# Patient Record
Sex: Male | Born: 1995 | Race: Black or African American | Hispanic: No | Marital: Single | Smoking: Never smoker
Health system: Southern US, Community
[De-identification: ages and names within clinical notes are randomized; demographics above are authoritative.]

## PROBLEM LIST (undated history)

## (undated) DIAGNOSIS — F988 Other specified behavioral and emotional disorders with onset usually occurring in childhood and adolescence: Secondary | ICD-10-CM

---
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
## 2017-09-18 ENCOUNTER — Encounter (HOSPITAL_COMMUNITY): Payer: Self-pay

## 2017-09-18 ENCOUNTER — Emergency Department (HOSPITAL_COMMUNITY)
Admission: EM | Admit: 2017-09-18 | Discharge: 2017-09-18 | Disposition: A | Discharge status: Home / Self Care | Payer: No Typology Code available for payment source | Attending: Emergency Medicine | Admitting: Emergency Medicine

## 2017-09-18 ENCOUNTER — Emergency Department (HOSPITAL_COMMUNITY): Payer: No Typology Code available for payment source

## 2017-09-18 DIAGNOSIS — Y9389 Activity, other specified: Secondary | ICD-10-CM | POA: Diagnosis not present

## 2017-09-18 DIAGNOSIS — M7918 Myalgia, other site: Secondary | ICD-10-CM | POA: Diagnosis not present

## 2017-09-18 DIAGNOSIS — S161XXA Strain of muscle, fascia and tendon at neck level, initial encounter: Secondary | ICD-10-CM | POA: Diagnosis not present

## 2017-09-18 DIAGNOSIS — S199XXA Unspecified injury of neck, initial encounter: Secondary | ICD-10-CM | POA: Diagnosis present

## 2017-09-18 DIAGNOSIS — F909 Attention-deficit hyperactivity disorder, unspecified type: Secondary | ICD-10-CM | POA: Insufficient documentation

## 2017-09-18 DIAGNOSIS — Y929 Unspecified place or not applicable: Secondary | ICD-10-CM | POA: Insufficient documentation

## 2017-09-18 DIAGNOSIS — Y999 Unspecified external cause status: Secondary | ICD-10-CM | POA: Insufficient documentation

## 2017-09-18 HISTORY — DX: Other specified behavioral and emotional disorders with onset usually occurring in childhood and adolescence: F98.8

## 2017-09-18 MED ORDER — NAPROXEN 375 MG PO TABS: 375.0000 mg | ORAL_TABLET | Freq: Two times a day (BID) | ORAL | 0 refills | Status: DC

## 2017-09-18 MED ORDER — CYCLOBENZAPRINE HCL 10 MG PO TABS: 10.0000 mg | ORAL_TABLET | Freq: Two times a day (BID) | ORAL | 0 refills | Status: DC | PRN

## 2017-09-18 MED ORDER — CYCLOBENZAPRINE HCL 10 MG PO TABS
10.0000 mg | ORAL_TABLET | Freq: Once | ORAL | Status: AC
  Administered 2017-09-18: 10 mg via ORAL
  Filled 2017-09-18: qty 1

## 2017-09-18 MED ORDER — IBUPROFEN 400 MG PO TABS
400.0000 mg | ORAL_TABLET | Freq: Once | ORAL | Status: AC
  Administered 2017-09-18: 400 mg via ORAL
  Filled 2017-09-18: qty 1
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
## 2020-03-19 ENCOUNTER — Ambulatory Visit: Payer: Self-pay | Admitting: Family Medicine

## 2023-05-08 ENCOUNTER — Encounter (HOSPITAL_COMMUNITY): Payer: Self-pay

## 2023-05-08 ENCOUNTER — Ambulatory Visit (HOSPITAL_COMMUNITY)
Admission: EM | Admit: 2023-05-08 | Discharge: 2023-05-08 | Disposition: A | Discharge status: Home / Self Care | Payer: Medicaid Other | Attending: Emergency Medicine | Admitting: Emergency Medicine

## 2023-05-08 DIAGNOSIS — L02211 Cutaneous abscess of abdominal wall: Secondary | ICD-10-CM

## 2023-05-08 MED ORDER — CHLORHEXIDINE GLUCONATE 4 % EX SOLN: Freq: Every day | CUTANEOUS | 0 refills | Status: AC | PRN

## 2023-05-08 MED ORDER — DOXYCYCLINE HYCLATE 100 MG PO CAPS: 100.0000 mg | ORAL_CAPSULE | Freq: Two times a day (BID) | ORAL | 0 refills | Status: AC

## 2023-05-08 NOTE — ED Provider Notes (Signed)
[facility]    CSN: [id number] Arrival date & time: 05/08/23  1621      History   Chief Complaint Chief Complaint  Patient presents with   Skin Ulcer    HPI Thomas Richardson is a 27 y.o. male.   Patient presents to clinic with an abscess to his left lower quadrant of his abdominal pain for the past 4 days.  He did notice some purulent drainage while he was taking a hot shower.  Reports the area is painful and hot to touch.  Patient has been taking some leftover amoxicillin without much relief.  Has not had any medication for pain.  No previous history of abscesses.  The history is provided by the patient and medical records.    Past Medical History:  Diagnosis Date   Attention deficit disorder (ADD)     There are no problems to display for this patient.   History reviewed. No pertinent surgical history.     Home Medications    Prior to Admission medications   Medication Sig Start Date End Date Taking? Authorizing Provider  chlorhexidine (HIBICLENS) 4 % external liquid Apply topically daily as needed. 05/08/23  Yes Rinaldo Ratel, [place] N, FNP  doxycycline (VIBRAMYCIN) 100 MG capsule Take 1 capsule (100 mg total) by mouth 2 (two) times daily. 05/08/23  Yes Rinaldo Ratel, [place], FNP  cyclobenzaprine (FLEXERIL) 10 MG tablet Take 1 tablet (10 mg total) by mouth 2 (two) times daily as needed for muscle spasms. 09/18/17   Gwyneth Sprout, MD  naproxen (NAPROSYN) 375 MG tablet Take 1 tablet (375 mg total) by mouth 2 (two) times daily. 09/18/17   Gwyneth Sprout, MD    Family History History reviewed. No pertinent family history.  Social History Social History   Tobacco Use   Smoking status: Never   Smokeless tobacco: Never  Vaping Use   Vaping Use: Unknown  Substance Use Topics   Alcohol use: No   Drug use: No     Allergies   Patient has no known allergies.   Review of Systems Review of Systems  Constitutional:  Negative for fever.      Physical Exam Triage Vital Signs ED Triage Vitals [05/08/23 1635]  Enc Vitals Group     BP 123/74     Pulse Rate (!) 109     Resp 16     Temp 98.7 F (37.1 C)     Temp src      SpO2 98 %     Weight      Height      Head Circumference      Peak Flow      Pain Score 5     Pain Loc      Pain Edu?      Excl. in GC?    No data found.  Updated Vital Signs BP 123/74   Pulse (!) 109   Temp 98.7 F (37.1 C)   Resp 16   SpO2 98%   Visual Acuity Right Eye Distance:   Left Eye Distance:   Bilateral Distance:    Right Eye Near:   Left Eye Near:    Bilateral Near:     Physical Exam Vitals and nursing note reviewed.  Constitutional:      Appearance: Normal appearance.  HENT:     Head: Normocephalic and atraumatic.     Right Ear: External ear normal.     Left Ear: External ear normal.     Nose: Nose  normal.     Mouth/Throat:     Mouth: Mucous membranes are moist.  Eyes:     Conjunctiva/sclera: Conjunctivae normal.  Cardiovascular:     Rate and Rhythm: Tachycardia present.  Pulmonary:     Effort: Pulmonary effort is normal. No respiratory distress.  Abdominal:     General: Abdomen is flat.     Palpations: Abdomen is soft.     Tenderness: There is no abdominal tenderness.       Comments: Indurated abscess to left lower abdominal wall. No fluctuance. Pinpoint head of purulence. Tender and warm to touch.   Musculoskeletal:        General: No swelling. Normal range of motion.     Cervical back: Normal range of motion.  Skin:    General: Skin is warm and dry.  Neurological:     General: No focal deficit present.     Mental Status: He is alert.  Psychiatric:        Mood and Affect: Mood normal.        Behavior: Behavior is cooperative.      UC Treatments / Results  Labs (all labs ordered are listed, but only abnormal results are displayed) Labs Reviewed - No data to display  EKG   Radiology No results found.  Procedures Procedures (including  critical care time)  Medications Ordered in UC Medications - No data to display  Initial Impression / Assessment and Plan / UC Course  I have reviewed the triage vital signs and the nursing notes.  Pertinent labs & imaging results that were available during my care of the patient were reviewed by me and considered in my medical decision making (see chart for details).  Vitals and triage reviewed, patient is hemodynamically stable.  Has a large abscess to his left lower abdominal wall, 10 cm x 7 cm, approximately.  Area is indurated, without fluctuance.  Will place on doxycycline for MRSA coverage, advised to return to clinic in 72 hours for potential incision and drainage / re-eval.  Hopefully the antibiotics will shrink down the area, as well as warm compresses.  Surgical consult may be indicated if still severe in 72 hours, versus incision and drainage.  Symptomatic management and pain relief discussed.  Plan of care, follow-up care and return precautions given, no questions at this time.     Final Clinical Impressions(s) / UC Diagnoses   Final diagnoses:  Cutaneous abscess of abdominal wall     Discharge Instructions      You have an abscess to your lower abdominal area. Please use warm compress with the antibacterial solution Hibiclens for 10-15 min 3x daily to help draw the pus to the surface.  Stop taking the amoxicillin, as this is ineffective for these types of infections.  Please take the doxycycline twice daily until finished, you can take it with food to prevent gastrointestinal upset.  For pain and discomfort you can take 800 mg of ibuprofen every 8 hours.  Please return to clinic in the next 72 hours for wound recheck and potential incision and drainage.  Please seek immediate care if you develop emesis, sudden worsening of pain, or any new concerning symptoms.      ED Prescriptions     Medication Sig Dispense Auth. Provider   doxycycline (VIBRAMYCIN) 100 MG capsule  Take 1 capsule (100 mg total) by mouth 2 (two) times daily. 20 capsule Rinaldo Ratel, [place] N, [place]   chlorhexidine (HIBICLENS) 4 % external liquid Apply topically daily as  needed. 118 mL Nirvana Blanchett, [place] N, [place]      PDMP not reviewed this encounter.   Deontae Robson, [place] N, [place] 05/08/23 [phone]

## 2023-05-08 NOTE — Discharge Instructions (Addendum)
You have an abscess to your lower abdominal area. Please use warm compress with the antibacterial solution Hibiclens for 10-15 min 3x daily to help draw the pus to the surface.  Stop taking the amoxicillin, as this is ineffective for these types of infections.  Please take the doxycycline twice daily until finished, you can take it with food to prevent gastrointestinal upset.  For pain and discomfort you can take 800 mg of ibuprofen every 8 hours.  Please return to clinic in the next 72 hours for wound recheck and potential incision and drainage.  Please seek immediate care if you develop emesis, sudden worsening of pain, or any new concerning symptoms.

## 2023-05-08 NOTE — ED Triage Notes (Signed)
Pt presents to uc with co of abscess to Llq for 4 days pt reports he has had drainage from it and blood. Painful and hot to touch. Pt  has been taking amoxicillin left over from mom

## 2023-05-11 ENCOUNTER — Encounter (HOSPITAL_COMMUNITY): Payer: Self-pay

## 2023-05-11 ENCOUNTER — Ambulatory Visit (HOSPITAL_COMMUNITY)
Admission: RE | Admit: 2023-05-11 | Discharge: 2023-05-11 | Disposition: A | Discharge status: Home / Self Care | Payer: Medicaid Other | Source: Ambulatory Visit | Attending: Family Medicine | Admitting: Family Medicine

## 2023-05-11 VITALS — BP 114/72 | HR 61 | Temp 97.9°F | Resp 16 | Ht 64.0 in | Wt 197.0 lb

## 2023-05-11 DIAGNOSIS — L02211 Cutaneous abscess of abdominal wall: Secondary | ICD-10-CM | POA: Diagnosis not present

## 2023-05-11 MED ORDER — HYDROCODONE-ACETAMINOPHEN 5-325 MG PO TABS: 1.0000 | ORAL_TABLET | Freq: Four times a day (QID) | ORAL | 0 refills | Status: AC | PRN

## 2023-05-11 NOTE — Discharge Instructions (Signed)

## 2023-05-11 NOTE — ED Triage Notes (Signed)
Patient here today f/u of abscess on LLQ of abd. Patient has been taking his medication as prescribed and it seems to be getting better. Patient is here today to see if it needs to be drained.

## 2023-05-13 ENCOUNTER — Encounter (HOSPITAL_COMMUNITY): Payer: Self-pay

## 2023-05-13 ENCOUNTER — Other Ambulatory Visit: Payer: Self-pay

## 2023-05-13 ENCOUNTER — Ambulatory Visit (HOSPITAL_COMMUNITY)
Admission: EM | Admit: 2023-05-13 | Discharge: 2023-05-13 | Disposition: A | Discharge status: Home / Self Care | Payer: Medicaid Other | Attending: Emergency Medicine | Admitting: Emergency Medicine

## 2023-05-13 VITALS — BP 132/72 | HR 77 | Temp 97.5°F | Resp 18 | Ht 64.0 in | Wt 198.4 lb

## 2023-05-13 DIAGNOSIS — L02211 Cutaneous abscess of abdominal wall: Secondary | ICD-10-CM

## 2023-05-13 MED ORDER — AMOXICILLIN-POT CLAVULANATE 875-125 MG PO TABS: 1.0000 | ORAL_TABLET | Freq: Two times a day (BID) | ORAL | 0 refills | Status: AC

## 2023-05-13 NOTE — ED Provider Notes (Signed)
  [facility]   [id number] 05/11/23 Arrival Time: 4098  ASSESSMENT & PLAN:  1. Abscess of abdominal wall    Incision and Drainage Procedure Note  Anesthesia: 1% lidocaine with epinephrine  Procedure Details  The procedure, risks and complications have been discussed in detail (including, but not limited to pain and bleeding) with the patient.  The skin induration was prepped and draped in the usual fashion. After adequate local anesthesia, I&D with a #11 blade was performed on the left lower abdominal wall with copious, bloody, purulent drainage.  EBL: minimal Drains: none Packing: 1/4" iodoform Condition: Tolerated procedure well Complications: none.  Meds ordered this encounter  Medications   HYDROcodone-acetaminophen (NORCO/VICODIN) 5-325 MG tablet    Sig: Take 1 tablet by mouth every 6 (six) hours as needed for moderate pain or severe pain.    Dispense:  8 tablet    Refill:  0   Wound care instructions discussed and given in written format. To return in 48 hours for wound check.  Finish all antibiotics. OTC analgesics as needed.  Reviewed expectations re: course of current medical issues. Questions answered. Outlined signs and symptoms indicating need for more acute intervention. Patient verbalized understanding. After Visit Summary given.   SUBJECTIVE:  Thomas Richardson is a 27 y.o. male who was seen here a few days ago; started on doxycycline for skin infection of abd. Here for recheck. Still tender; no drainage/bleeding. Denies fever.  OBJECTIVE:  Vitals:   05/11/23 0837 05/11/23 0838  BP:  114/72  Pulse:  61  Resp:  16  Temp:  97.9 F (36.6 C)  TempSrc:  Oral  SpO2:  97%  Weight: 89.4 kg   Height: 5\' 4"  (1.626 m)      General appearance: alert; no distress Abd: approx 2x4 cm induration of his LEFT lower abdomen; tender to touch; no active drainage or bleeding Psychological: alert and cooperative; normal mood and affect  No Known  Allergies  Past Medical History:  Diagnosis Date   Attention deficit disorder (ADD)    Social History   Socioeconomic History   Marital status: Single    Spouse name: Not on file   Number of children: Not on file   Years of education: Not on file   Highest education level: Not on file  Occupational History   Not on file  Tobacco Use   Smoking status: Never   Smokeless tobacco: Never  Vaping Use   Vaping Use: Unknown  Substance and Sexual Activity   Alcohol use: No   Drug use: No   Sexual activity: Never  Other Topics Concern   Not on file  Social History Narrative   Not on file   Social Determinants of Health   Financial Resource Strain: Not on file  Food Insecurity: Not on file  Transportation Needs: Not on file  Physical Activity: Not on file  Stress: Not on file  Social Connections: Not on file   History reviewed. No pertinent family history. History reviewed. No pertinent surgical history.          Mardella Layman, MD 05/13/23 [phone]

## 2023-05-13 NOTE — ED Triage Notes (Signed)
Pt here to have abscess recheck, pt states it still feeling hard after draining it.

## 2023-05-13 NOTE — Discharge Instructions (Addendum)
FINISH the full course of doxycycline. You should have 5 days left.  START the augmentin. Take twice daily for 5 days.  Take both with food to avoid upset stomach  If you have worsening pain, or the abscess starts to increase in size, go directly to the emergency department.

## 2023-05-13 NOTE — ED Provider Notes (Signed)
[facility]    CSN: [id number] Arrival date & time: 05/13/23  1529     History   Chief Complaint Chief Complaint  Patient presents with   Abscess    Pt here to have abscess recheck, pt states it still feeling hard after draining it.    HPI Thomas Richardson is a 27 y.o. male.  Here for wound check Had abdominal wall abscess evaluated on 6/14, was started on doxycycline BID x 10 days Returned for recheck on 6/17, area was drained, packing placed. Patient given Vicodin for pain which helps  Here today for recheck as advised by previous provider Reports area is still hard. Maybe looks smaller. Not bigger, not more painful.   He has so far taken 5 days of the doxy  No fever or chills  Past Medical History:  Diagnosis Date   Attention deficit disorder (ADD)     There are no problems to display for this patient.   History reviewed. No pertinent surgical history.  Home Medications    Prior to Admission medications   Medication Sig Start Date End Date Taking? Authorizing Provider  amoxicillin-clavulanate (AUGMENTIN) 875-125 MG tablet Take 1 tablet by mouth every 12 (twelve) hours for 5 days. 05/13/23 05/18/23 Yes Elica Almas, Lurena Joiner, PA-C  chlorhexidine (HIBICLENS) 4 % external liquid Apply topically daily as needed. 05/08/23   Garrison, [place] N, FNP  doxycycline (VIBRAMYCIN) 100 MG capsule Take 1 capsule (100 mg total) by mouth 2 (two) times daily. 05/08/23   Garrison, [place] N, FNP  HYDROcodone-acetaminophen (NORCO/VICODIN) 5-325 MG tablet Take 1 tablet by mouth every 6 (six) hours as needed for moderate pain or severe pain. 05/11/23   Mardella Layman, MD    Family History History reviewed. No pertinent family history.  Social History Social History   Tobacco Use   Smoking status: Never   Smokeless tobacco: Never  Vaping Use   Vaping Use: Unknown  Substance Use Topics   Alcohol use: No   Drug use: No     Allergies   Patient has no known  allergies.   Review of Systems Review of Systems As per HPI  Physical Exam Triage Vital Signs ED Triage Vitals [05/13/23 1537]  Enc Vitals Group     BP 132/72     Pulse Rate 77     Resp 18     Temp (!) 97.5 F (36.4 C)     Temp Source Oral     SpO2 99 %     Weight      Height      Head Circumference      Peak Flow      Pain Score 0     Pain Loc      Pain Edu?      Excl. in GC?    No data found.  Updated Vital Signs BP 132/72 (BP Location: Right Arm)   Pulse 77   Temp (!) 97.5 F (36.4 C) (Oral)   Resp 18   Ht 5\' 4"  (1.626 m)   Wt 198 lb 6.6 oz (90 kg)   SpO2 99%   BMI 34.06 kg/m   Physical Exam Vitals and nursing note reviewed.  Constitutional:      Appearance: Normal appearance.  HENT:     Mouth/Throat:     Mouth: Mucous membranes are moist.     Pharynx: Oropharynx is clear.  Eyes:     Conjunctiva/sclera: Conjunctivae normal.  Cardiovascular:     Rate and Rhythm: Normal  rate and regular rhythm.     Heart sounds: Normal heart sounds.  Pulmonary:     Effort: Pulmonary effort is normal. No respiratory distress.     Breath sounds: Normal breath sounds.  Abdominal:     Palpations: There is mass.       Comments: Indurated area of left lower quadrant. Appears smaller than previously documented. Wound packing removed. Scant purulence and blood. No further drainage expressed with palpation   Musculoskeletal:        General: Normal range of motion.  Skin:    General: Skin is warm and dry.  Neurological:     Mental Status: He is alert and oriented to person, place, and time.     UC Treatments / Results  Labs (all labs ordered are listed, but only abnormal results are displayed) Labs Reviewed - No data to display  EKG  Radiology No results found.  Procedures Procedures (including critical care time)  Medications Ordered in UC Medications - No data to display  Initial Impression / Assessment and Plan / UC Course  I have reviewed the triage  vital signs and the nursing notes.  Pertinent labs & imaging results that were available during my care of the patient were reviewed by me and considered in my medical decision making (see chart for details).  Abscess appears smaller according to patient and guardian. Reassuring. He will continue the doxycyline and finish full course. Will add Augmentin BID x 5 days given area is still very indurated. Discussed with any worsening pain, or if abscess increases in size, needs to be evaluated in the emergency department. Patient agrees to plan. No questions at this time  Final Clinical Impressions(s) / UC Diagnoses   Final diagnoses:  Abscess of abdominal wall     Discharge Instructions      FINISH the full course of doxycycline. You should have 5 days left.  START the augmentin. Take twice daily for 5 days.  Take both with food to avoid upset stomach  If you have worsening pain, or the abscess starts to increase in size, go directly to the emergency department.      ED Prescriptions     Medication Sig Dispense Auth. Provider   amoxicillin-clavulanate (AUGMENTIN) 875-125 MG tablet Take 1 tablet by mouth every 12 (twelve) hours for 5 days. 10 tablet Anjela Cassara, Lurena Joiner, PA-C      PDMP not reviewed this encounter.   Aceson Labell, Lurena Joiner, [place] 05/13/23 1659
